# Patient Record
Sex: Female | Born: 1975 | Hispanic: No | Marital: Married | Smoking: Never smoker
Health system: Southern US, Community
[De-identification: ages and names within clinical notes are randomized; demographics above are authoritative.]

## PROBLEM LIST (undated history)

## (undated) ENCOUNTER — Inpatient Hospital Stay (HOSPITAL_COMMUNITY): Payer: Self-pay

## (undated) DIAGNOSIS — Z789 Other specified health status: Secondary | ICD-10-CM

## (undated) HISTORY — PX: NO PAST SURGERIES: SHX2092

---

## 1999-06-04 ENCOUNTER — Emergency Department (HOSPITAL_COMMUNITY): Admission: EM | Admit: 1999-06-04 | Discharge: 1999-06-04 | Payer: Self-pay | Admitting: Emergency Medicine

## 2000-03-26 ENCOUNTER — Other Ambulatory Visit: Admission: RE | Admit: 2000-03-26 | Discharge: 2000-03-26 | Payer: Self-pay | Admitting: Obstetrics & Gynecology

## 2000-09-13 ENCOUNTER — Inpatient Hospital Stay (HOSPITAL_COMMUNITY): Admission: AD | Admit: 2000-09-13 | Discharge: 2000-09-14 | Payer: Self-pay | Admitting: Obstetrics and Gynecology

## 2002-07-05 ENCOUNTER — Other Ambulatory Visit: Admission: RE | Admit: 2002-07-05 | Discharge: 2002-07-05 | Payer: Self-pay | Admitting: Obstetrics & Gynecology

## 2003-08-27 ENCOUNTER — Other Ambulatory Visit: Admission: RE | Admit: 2003-08-27 | Discharge: 2003-08-27 | Payer: Self-pay | Admitting: *Deleted

## 2004-05-30 ENCOUNTER — Inpatient Hospital Stay (HOSPITAL_COMMUNITY): Admission: AD | Admit: 2004-05-30 | Discharge: 2004-05-30 | Payer: Self-pay | Admitting: Obstetrics and Gynecology

## 2004-12-20 ENCOUNTER — Inpatient Hospital Stay (HOSPITAL_COMMUNITY): Admission: AD | Admit: 2004-12-20 | Discharge: 2004-12-22 | Payer: Self-pay | Admitting: *Deleted

## 2014-09-14 ENCOUNTER — Encounter (HOSPITAL_COMMUNITY): Payer: Self-pay | Admitting: *Deleted

## 2014-09-14 ENCOUNTER — Inpatient Hospital Stay (HOSPITAL_COMMUNITY): Payer: Self-pay

## 2014-09-14 ENCOUNTER — Inpatient Hospital Stay (HOSPITAL_COMMUNITY)
Admission: AD | Admit: 2014-09-14 | Discharge: 2014-09-14 | Disposition: A | Discharge status: Home / Self Care | Payer: Self-pay | Source: Ambulatory Visit | Attending: Obstetrics & Gynecology | Admitting: Obstetrics & Gynecology

## 2014-09-14 DIAGNOSIS — Z3A01 Less than 8 weeks gestation of pregnancy: Secondary | ICD-10-CM | POA: Insufficient documentation

## 2014-09-14 DIAGNOSIS — O209 Hemorrhage in early pregnancy, unspecified: Secondary | ICD-10-CM | POA: Insufficient documentation

## 2014-09-14 DIAGNOSIS — O26891 Other specified pregnancy related conditions, first trimester: Secondary | ICD-10-CM | POA: Insufficient documentation

## 2014-09-14 DIAGNOSIS — O4691 Antepartum hemorrhage, unspecified, first trimester: Secondary | ICD-10-CM

## 2014-09-14 DIAGNOSIS — N898 Other specified noninflammatory disorders of vagina: Secondary | ICD-10-CM | POA: Insufficient documentation

## 2014-09-14 HISTORY — DX: Other specified health status: Z78.9

## 2014-09-14 LAB — URINALYSIS, ROUTINE W REFLEX MICROSCOPIC
Bilirubin Urine: NEGATIVE
Glucose, UA: NEGATIVE mg/dL
Ketones, ur: NEGATIVE mg/dL
NITRITE: NEGATIVE
PH: 6 (ref 5.0–8.0)
Protein, ur: NEGATIVE mg/dL
UROBILINOGEN UA: 0.2 mg/dL (ref 0.0–1.0)

## 2014-09-14 LAB — CBC
HEMATOCRIT: 33.8 % — AB (ref 36.0–46.0)
Hemoglobin: 11.7 g/dL — ABNORMAL LOW (ref 12.0–15.0)
MCH: 32.1 pg (ref 26.0–34.0)
MCHC: 34.6 g/dL (ref 30.0–36.0)
MCV: 92.9 fL (ref 78.0–100.0)
PLATELETS: 270 10*3/uL (ref 150–400)
RBC: 3.64 MIL/uL — AB (ref 3.87–5.11)
RDW: 12.8 % (ref 11.5–15.5)
WBC: 8.6 10*3/uL (ref 4.0–10.5)

## 2014-09-14 LAB — WET PREP, GENITAL
Clue Cells Wet Prep HPF POC: NONE SEEN
Trich, Wet Prep: NONE SEEN
YEAST WET PREP: NONE SEEN

## 2014-09-14 LAB — URINE MICROSCOPIC-ADD ON

## 2014-09-14 LAB — ABO/RH: ABO/RH(D): B POS

## 2014-09-14 LAB — HCG, QUANTITATIVE, PREGNANCY: hCG, Beta Chain, Quant, S: 8698 m[IU]/mL — ABNORMAL HIGH (ref <5)

## 2014-09-14 LAB — POCT PREGNANCY, URINE: PREG TEST UR: POSITIVE — AB

## 2014-09-14 MED ORDER — CEPHALEXIN 500 MG PO CAPS: 500.0000 mg | ORAL_CAPSULE | Freq: Three times a day (TID) | ORAL | Status: AC

## 2014-09-14 NOTE — Discharge Instructions (Signed)
Ice/Tylenol for discomfort.    Reposo plvico  (Pelvic Rest) El reposo plvico se recomienda a las mujeres cuando:   La placenta cubre parcial o completamente la abertura del cuello del tero (placenta previa).  Hay sangrado entre la pared del tero y el saco amnitico en el primer trimestre (hemorragia subcorinica).  El cuello uterino comienza a abrirse sin iniciarse el trabajo de parto (cuello uterino incompetente, insuficiencia cervical).  El Rosstrabajo de parto se inicia muy pronto (parto prematuro). INSTRUCCIONES PARA EL CUIDADO EN EL HOGAR   No tenga relaciones sexuales, estimulacin, ni orgasmos.  No use tampones, no se haga duchas vaginales ni coloque ningn objeto en la vagina.  No levante objetos que pesen ms de 10 libras (4,5 kg).  Evite las actividades extenuantes o tensionar los msculos de la pelvis. SOLICITE ATENCIN MDICA SI:   Tiene un sangrado vaginal durante el embarazo. Considrelo como una posible emergencia.  Siente clicos en la zona baja del estmago (ms fuertes que los clicos menstruales).  Nota flujo vaginal (acuoso, con moco o [place]oss[place]angre).  Siente un dolor en la espalda leve y sordo.  Tiene contracciones regulares o endurecimiento del tero. SOLICITE ATENCIN MDICA DE INMEDIATO SI:  Observa sangrado vaginal y tiene placenta previa.  Document Released: 06/29/2012 [facility] Patient Information 2015 [place]ExitCare, MarylandLLC. This information is not intended to replace advice given to you by your health care provider. Make sure you discuss any questions you have with your health care provider. Hemorragia vaginal durante el embarazo (primer trimestre) (Vaginal Bleeding During Pregnancy, First Trimester) Durante los primeros meses de G[place]enembarazo, es comn tener una pequea hemorragia vaginal (manchas). A veces, la hemorragia es normal y no representa un problema, pero en algunas ocasiones es un sntoma de algo grave. Asegrese de decirle a su mdico de inmediato si  tiene algn tipo de hemorragia vaginal. CUIDADOS EN EL HOGAR  Controle su afeccin para ver si hay cambios.  Siga las indicaciones de su mdico con respecto al Irwingrado de actividad que [place]puede tener.  Si debe hacer reposo en cama:  Es posible que deba quedarse en cama y levantarse nicamente para ir al bao.  Quizs le permitan hacer The PNC Financialalgunas actividades.  Si es necesario, planifique que alguien la ayude.  Marcelino FreestoneEscriba:  La cantidad de toallas higinicas que [place]ausa cada da.  La frecuencia con la que se cambia las toallas higinicas.  Indique que tan empapados (saturados) estn.  No use tampones.  No se haga duchas vaginales.  No tenga relaciones sexuales ni orgasmos hasta que el mdico la autorice.  Si elimina tejido por la vagina, gurdelo para mostrrselo al American Expressmdico.  Tome los medicamentos solamente como se lo haya indicado el mdico.  No tome aspirina, ya que puede causar hemorragias.  Concurra a todas las visitas de control como se lo haya indicado el mdico. SOLICITE AYUDA SI:   Tiene una hemorragia vaginal.  Tiene clicos.  Tiene dolores de [place]parto.  Tiene fiebre que no desaparece despus de Teacher, adult educationtomar medicamentos. SOLICITE AYUDA DE INMEDIATO SI:   Siente clicos muy intensos en la espalda o en el vientre (abdomen).  Elimina cogulos grandes o tejido por la vagina.  Tiene ms hemorragia.  Se siente dbil o que va a desvanecerse.  Pierde el conocimiento (se desmaya).  Tiene escalofros.  Tiene una prdida importante o sale lquido a borbotones por la vagina.  Se desmaya mientras defeca. ASEGRESE DE QUE:  Comprende estas instrucciones.  Controlar su afeccin.  Recibir ayuda de inmediato si no mejora o si  empeora. Document Released: 02/19/2014 [facility]Exit[facility] Patient Information 2015 [place]villeExitCare, MarylandLLC. This information is not intended to replace advice given to you by your health care provider. Make sure you discuss any questions you have with your health care  provider.

## 2014-09-14 NOTE — MAU Note (Addendum)
thinks she is 6 wks preg, started bleeding last night, coffee colored d/c today. No pain, some itching.

## 2014-09-14 NOTE — MAU Provider Note (Signed)
History     CSN: [id number]  Arrival date and time: 09/14/14 1157   First Provider Initiated Contact with Patient 09/14/14 1314      Chief Complaint  Patient presents with  . Possible Pregnancy  . Vaginal Bleeding   HPI Melanie CroftMaria De Laluz [id number] 38 y.o. [id number] @[redacted]w[redacted]d  presents to MAU complaining of vaginal bleeding that started yesterday.  It was noticed last night with wiping and she has had some old dark blood passed today.  She denies abdominal pain, nausea, vomiting, fever, weakness, dysuria.   OB History    Gravida Para Term Preterm AB TAB SAB Ectopic Multiple Living   3 2 2       2       Past Medical History  Diagnosis Date  . Medical history non-contributory     Past Surgical History  Procedure Laterality Date  . No past surgeries      No family history on file.  History  Substance Use Topics  . Smoking status: Never Smoker   . Smokeless tobacco: Not on file  . Alcohol Use: No    Allergies: No Known Allergies  No prescriptions prior to admission    ROS Pertinent ROS in HPI Physical Exam   Blood pressure 123/74, pulse 82, temperature 99.2 F (37.3 C), temperature source Oral, resp. rate 16, height 4\' 10"  (1.473 m), weight 122 lb (55.339 kg), last menstrual period 07/31/2014.  Physical Exam  Constitutional: She is oriented to person, place, and time. She appears well-developed and well-nourished.  HENT:  Head: Normocephalic and atraumatic.  Eyes: EOM are normal.  Neck: Normal range of motion.  Cardiovascular: Normal rate and regular rhythm.   Respiratory: Effort normal and breath sounds normal. No respiratory distress.  GI: Soft. Bowel sounds are normal. She exhibits no distension. There is no tenderness. There is no rebound and no guarding.  Genitourinary:     Labia minora overlaying clitoral hood on left significant for red raised lesion approx 7mm, tender to palpation with no discharge.   Scant red blood visible in vagina.  Separately  distinguishable white vaginal discharge.  Wet prep obtained.   No CMT. No adnexal mass or tenderness   Musculoskeletal: Normal range of motion.  Neurological: She is alert and oriented to person, place, and time.  Skin: Skin is warm and dry.  Psychiatric: She has a normal mood and affect.   Results for orders placed or performed during the hospital encounter of 09/14/14 (from the past 24 hour(s))  Urinalysis, Routine w reflex microscopic     Status: Abnormal   Collection Time: 09/14/14 12:15 PM  Result Value Ref Range   Color, Urine STRAW (A) YELLOW   APPearance CLEAR CLEAR   Specific Gravity, Urine <1.005 (L) 1.005 - 1.030   pH 6.0 5.0 - 8.0   Glucose, UA NEGATIVE NEGATIVE mg/dL   Hgb urine dipstick SMALL (A) NEGATIVE   Bilirubin Urine NEGATIVE NEGATIVE   Ketones, ur NEGATIVE NEGATIVE mg/dL   Protein, ur NEGATIVE NEGATIVE mg/dL   Urobilinogen, UA 0.2 0.0 - 1.0 mg/dL   Nitrite NEGATIVE NEGATIVE   Leukocytes, UA SMALL (A) NEGATIVE  Urine microscopic-add on     Status: Abnormal   Collection Time: 09/14/14 12:15 PM  Result Value Ref Range   Squamous Epithelial / LPF FEW (A) RARE   WBC, UA 3-6 <3 WBC/hpf   RBC / HPF 0-2 <3 RBC/hpf  Pregnancy, urine POC     Status: Abnormal   Collection Time: 09/14/14 12:18 PM  Result Value Ref Range   Preg Test, Ur POSITIVE (A) NEGATIVE  CBC     Status: Abnormal   Collection Time: 09/14/14  1:26 PM  Result Value Ref Range   WBC 8.6 4.0 - 10.5 K/uL   RBC 3.64 (L) 3.87 - 5.11 MIL/uL   Hemoglobin 11.7 (L) 12.0 - 15.0 g/dL   HCT 84.133.8 (L) 32.436.0 - 40.146.0 %   MCV 92.9 78.0 - 100.0 fL   MCH 32.1 26.0 - 34.0 pg   MCHC 34.6 30.0 - 36.0 g/dL   RDW 02.712.8 25.311.5 - 66.415.5 %   Platelets 270 150 - 400 K/uL  ABO/Rh     Status: None   Collection Time: 09/14/14  1:26 PM  Result Value Ref Range   ABO/RH(D) B POS   hCG, quantitative, pregnancy     Status: Abnormal   Collection Time: 09/14/14  1:26 PM  Result Value Ref Range   hCG, Beta Chain, Quant, S 8698 (H) <5  mIU/mL  Wet prep, genital     Status: Abnormal   Collection Time: 09/14/14  1:30 PM  Result Value Ref Range   Yeast Wet Prep HPF POC NONE SEEN NONE SEEN   Trich, Wet Prep NONE SEEN NONE SEEN   Clue Cells Wet Prep HPF POC NONE SEEN NONE SEEN   WBC, Wet Prep HPF POC FEW (A) NONE SEEN   [place] Ob Comp Less 14 Wks  09/14/2014   CLINICAL DATA:  Pregnant, vaginal bleeding  EXAM: OBSTETRIC <14 WK US AND TRANSVAGINAL OB US  TECHNIQUE: Both transabdominal and transvaginal ultrasound examinations were performed for complete evaluation of the gestation as well as the maternal uterus, adnexal regions, and pelvic cul-de-sac. Transvaginal technique was performed to assess early pregnancy.  COMPARISON:  None.  FINDINGS: Intrauterine gestational sac: Visualized/normal in shape.  Yolk sac:  Present  Embryo:  Present  Cardiac Activity: Not visualized  MSD:  10.4  mm   5 w   5 d  US EDC: 01/2005  CRL:  1.3 mm, not within measurable range  Maternal uterus/adnexae: Moderate subchronic hemorrhage.  Right ovary is within normal limits, measuring 2.3 x 1.4 x 1.6 cm.  Left ovary measures 3.2 x 1.9 x 2.1 cm and is notable for a corpus luteal cyst.  No free fluid.  IMPRESSION: Single intrauterine gestation, measuring 5 weeks 5 days by mean sac diameter. Crown-rump measures 1.3 mm, which is not within the measurable range for estimated gestational age.  No definite cardiac activity is visualized, although this may be due to early gestational age.  Follow-up pelvic ultrasound is suggested in 7 days to confirm viability.   Electronically Signed   By: Charline BillsSriyesh  Krishnan M.D.   On: 09/14/2014 14:35   [place] Ob Transvaginal  09/14/2014   CLINICAL DATA:  Pregnant, vaginal bleeding  EXAM: OBSTETRIC <14 WK US AND TRANSVAGINAL OB US  TECHNIQUE: Both transabdominal and transvaginal ultrasound examinations were performed for complete evaluation of the gestation as well as the maternal uterus, adnexal regions, and pelvic cul-de-sac. Transvaginal  technique was performed to assess early pregnancy.  COMPARISON:  None.  FINDINGS: Intrauterine gestational sac: Visualized/normal in shape.  Yolk sac:  Present  Embryo:  Present  Cardiac Activity: Not visualized  MSD:  10.4  mm   5 w   5 d  US EDC: 01/2005  CRL:  1.3 mm, not within measurable range  Maternal uterus/adnexae: Moderate subchronic hemorrhage.  Right ovary is within normal limits, measuring 2.3 x 1.4 x 1.6  cm.  Left ovary measures 3.2 x 1.9 x 2.1 cm and is notable for a corpus luteal cyst.  No free fluid.  IMPRESSION: Single intrauterine gestation, measuring 5 weeks 5 days by mean sac diameter. Crown-rump measures 1.3 mm, which is not within the measurable range for estimated gestational age.  No definite cardiac activity is visualized, although this may be due to early gestational age.  Follow-up pelvic ultrasound is suggested in 7 days to confirm viability.   Electronically Signed   By: Charline Bills M.D.   On: 09/14/2014 14:35     MAU Course  Procedures  MDM Vaginal bleeding in setting of positive PRT.  [place] ordered.  No evidence for ectopic.  Yolk sac present.    Assessment and Plan  A: vaginal bleeding in pregnancy, small cyst of external genitalia  P: Discharge to home Obtain [facility] asap Keflex for cyst Tylenol/ice for discomfort Pelvic rest Patient may return to MAU as needed or if her condition were to change or worsen   Bertram Denver 09/14/2014, 1:46 PM

## 2014-09-14 NOTE — MAU Note (Signed)
Pt states she has a bump which appeared two weeks ago in her vagina.  Also is having some vaginal itching.

## 2014-09-15 LAB — GC/CHLAMYDIA PROBE AMP
CT Probe RNA: NEGATIVE
GC Probe RNA: NEGATIVE

## 2014-10-23 LAB — PROCEDURE REPORT - SCANNED: PAP SMEAR: NEGATIVE

## 2014-10-29 ENCOUNTER — Other Ambulatory Visit: Payer: Self-pay | Admitting: Obstetrics

## 2014-10-29 DIAGNOSIS — O019 Hydatidiform mole, unspecified: Secondary | ICD-10-CM

## 2014-11-01 ENCOUNTER — Ambulatory Visit (HOSPITAL_COMMUNITY)
Admission: RE | Admit: 2014-11-01 | Discharge: 2014-11-01 | Disposition: A | Discharge status: Home / Self Care | Payer: Self-pay | Source: Ambulatory Visit | Attending: Obstetrics | Admitting: Obstetrics

## 2014-11-01 DIAGNOSIS — O019 Hydatidiform mole, unspecified: Secondary | ICD-10-CM

## 2014-11-01 DIAGNOSIS — Z36 Encounter for antenatal screening of mother: Secondary | ICD-10-CM | POA: Insufficient documentation

## 2014-11-01 DIAGNOSIS — Z3A08 8 weeks gestation of pregnancy: Secondary | ICD-10-CM | POA: Insufficient documentation

## 2015-07-20 ENCOUNTER — Encounter (HOSPITAL_COMMUNITY): Payer: Self-pay | Admitting: *Deleted

## 2015-11-06 IMAGING — US US OB TRANSVAGINAL
1 series · 13 of 28 positions shown · non-contrast
Comparison: Ultrasound 09/14/2014

CLINICAL DATA: Patient for follow-up ultrasound. Prior suspicious
area raising the possibly of molar pregnancy.

EXAM:
TRANSVAGINAL OB ULTRASOUND
TECHNIQUE: Transvaginal ultrasound was performed for complete evaluation of the
gestation as well as the maternal uterus, adnexal regions, and
pelvic cul-de-sac.

[Series 1: us ob transvaginal · 72 acquisitions, 13 frames shown]
[im 3/72]
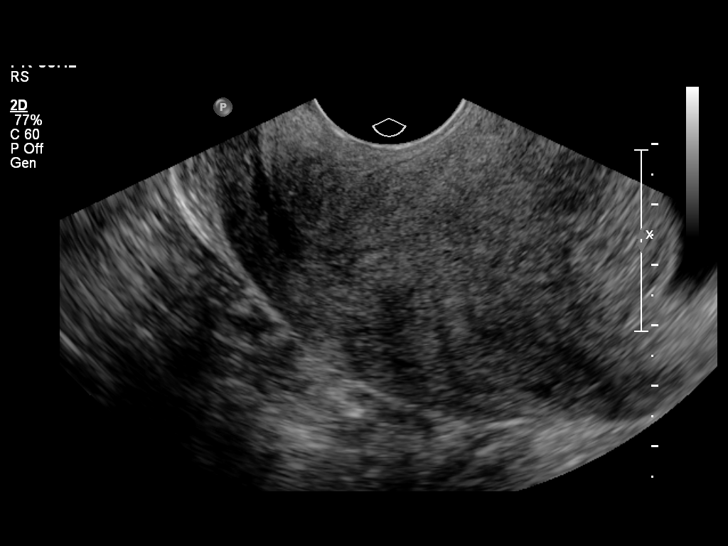
[im 8/72]
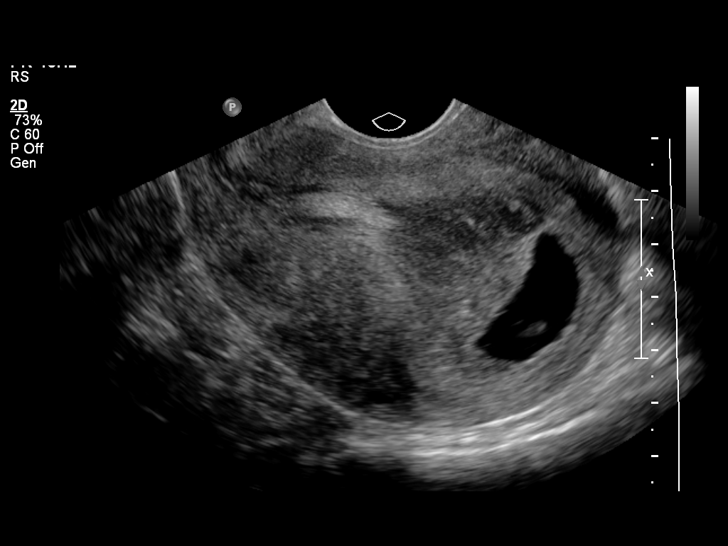
[im 14/72]
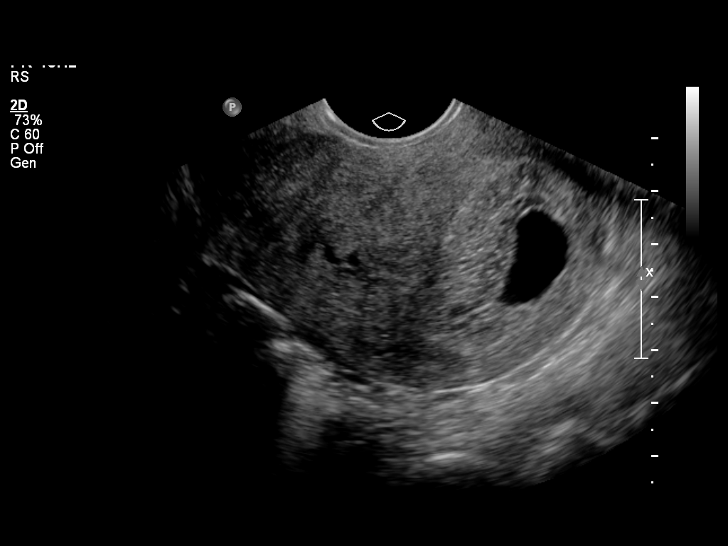
[im 19/72]
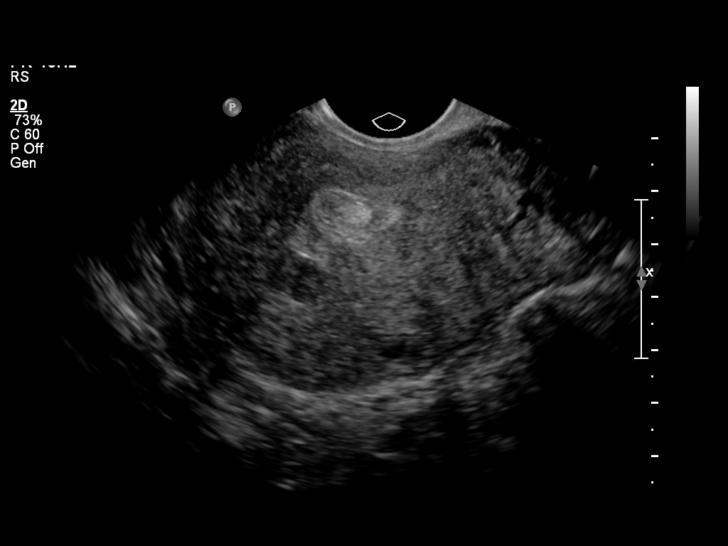
[im 24/72]
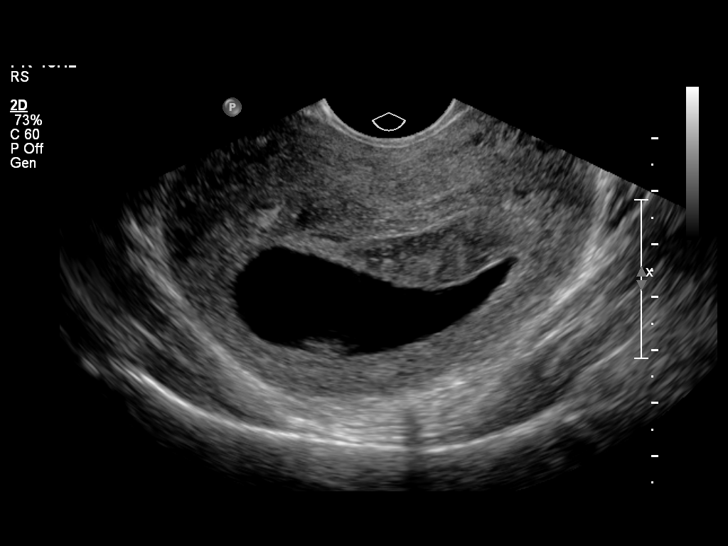
[im 29/72]
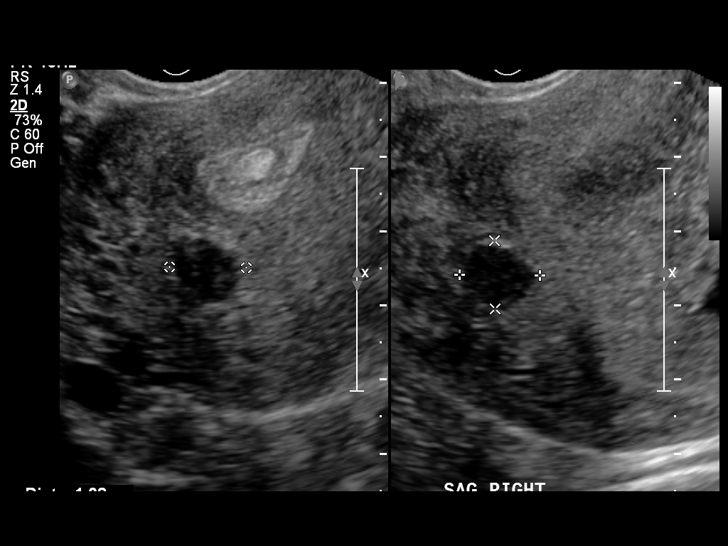
[im 37/72]
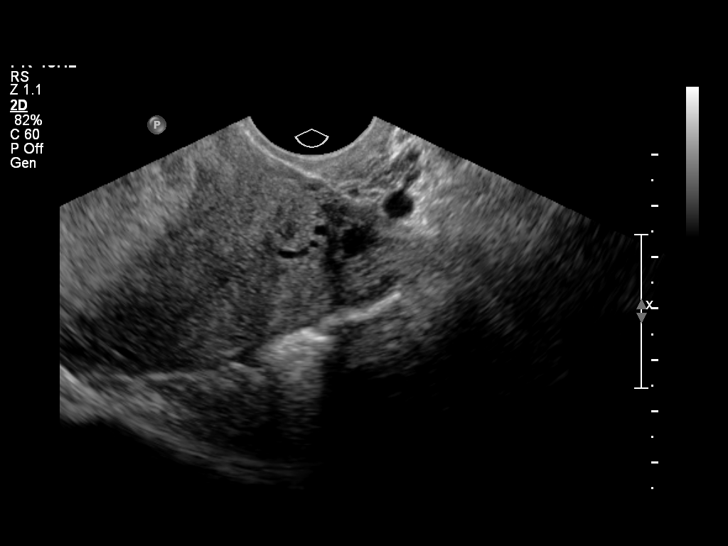
[im 43/72]
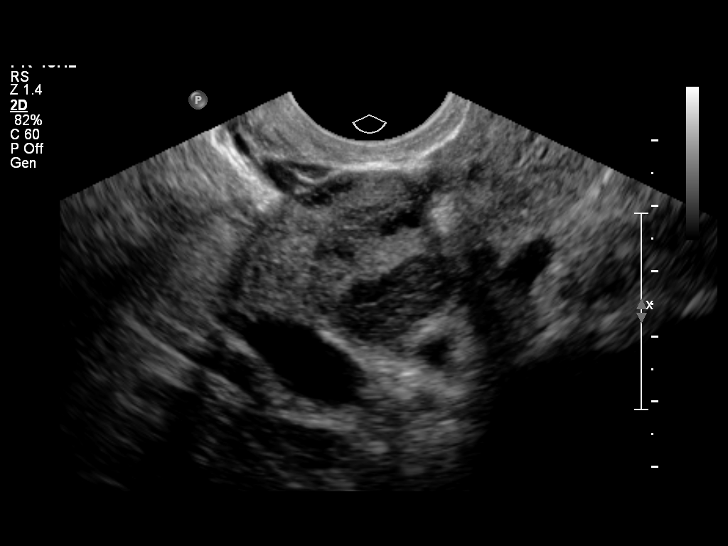
[im 48/72]
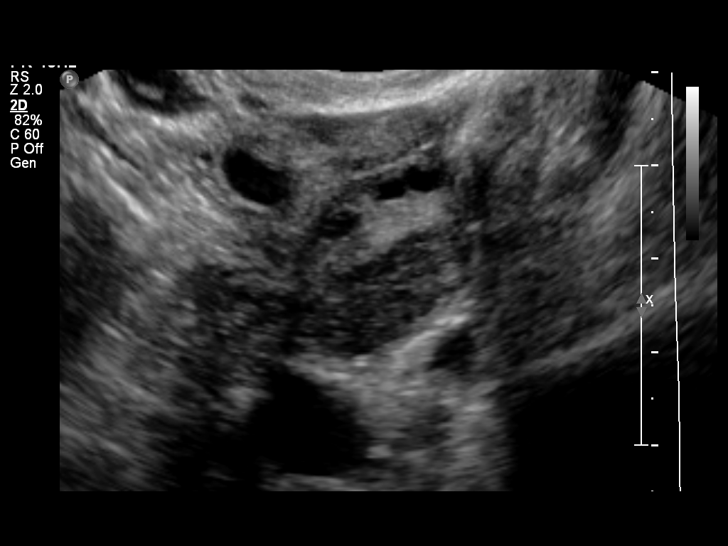
[im 53/72]
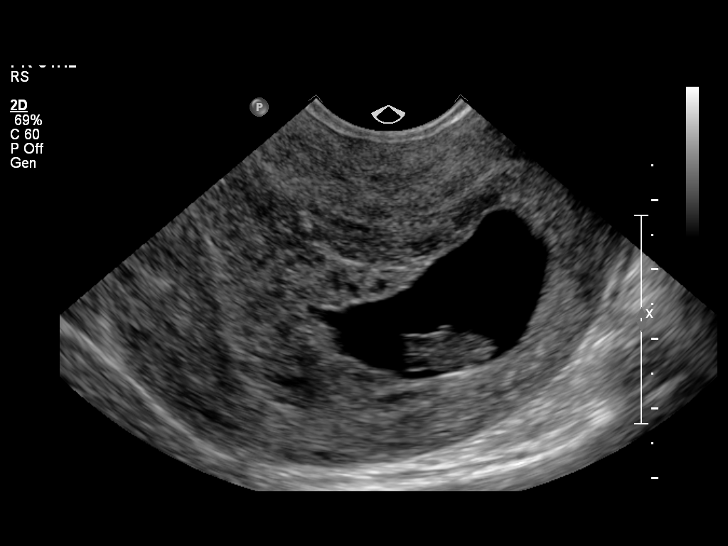
[im 58/72]
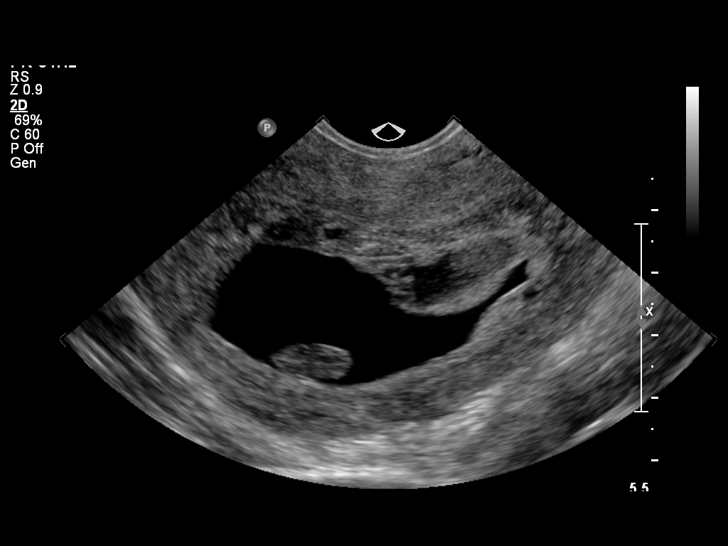
[im 64/72]
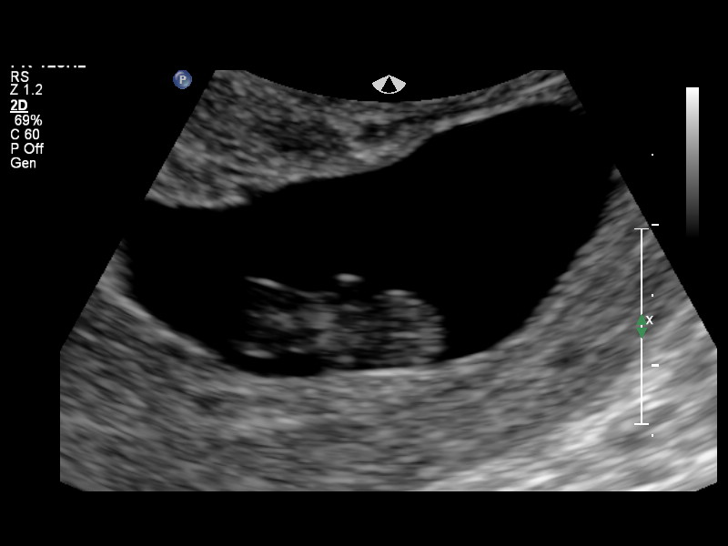
[im 69/72]
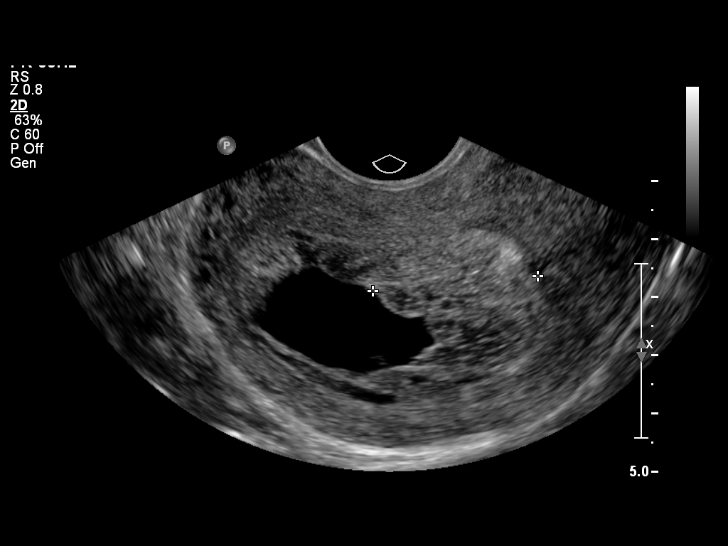

[13 of 28 positions shown; findings below may reference images not displayed]

FINDINGS: Intrauterine gestational sac: Present

Yolk sac:  Not visualized

Embryo:  Present

Cardiac Activity: Not present

CRL:   15.8  mm   8 w 0 d

Maternal uterus/adnexae: Adjacent to the gestational sac is a mixed
echogenicity mass with multiple internal cystic spaces. Additionally
there is a 10 x 9 x 10 mm intramural fibroid. The left ovary is
remarkable for a probable small corpus luteum. The right ovary is
not visualized. Subchorionic hematoma is demonstrated. No free fluid
in the pelvis.
IMPRESSION: 1. Fetal pole 15.8 mm. No fetal heartbeat identified. Findings meet
definitive criteria for failed pregnancy. This follows SRU consensus
guidelines: Diagnostic Criteria for Nonviable Pregnancy Early in the
First Trimester. N Engl J Med 0397;[DATE].
2. Within the uterus there is a multi cystic soft tissue mass which
is nonspecific and may represent cystic change of the placenta or
gestational trophoblastic disease. Recommend clinical, pathologic
and laboratory correlation.
Critical Value/emergent results were called by telephone at the time
of interpretation on 11/01/2014 at [DATE] to Dr. DAMARY BING ,
who verbally acknowledged these results.

## 2016-07-13 ENCOUNTER — Encounter: Payer: Self-pay | Admitting: *Deleted

## 2016-07-13 LAB — PROCEDURE REPORT - SCANNED: Pap: NEGATIVE

## 2016-07-15 ENCOUNTER — Encounter: Payer: Self-pay | Admitting: *Deleted

## 2018-10-31 ENCOUNTER — Other Ambulatory Visit (HOSPITAL_COMMUNITY): Payer: Self-pay | Admitting: *Deleted

## 2018-10-31 ENCOUNTER — Other Ambulatory Visit (HOSPITAL_COMMUNITY): Payer: Self-pay | Admitting: Obstetrics and Gynecology

## 2018-10-31 DIAGNOSIS — N644 Mastodynia: Secondary | ICD-10-CM

## 2018-12-06 ENCOUNTER — Ambulatory Visit (HOSPITAL_COMMUNITY): Payer: Self-pay

## 2018-12-06 ENCOUNTER — Other Ambulatory Visit: Payer: Self-pay

## 2019-04-26 ENCOUNTER — Other Ambulatory Visit (HOSPITAL_COMMUNITY): Payer: Self-pay | Admitting: *Deleted

## 2019-04-26 DIAGNOSIS — N644 Mastodynia: Secondary | ICD-10-CM

## 2019-06-01 ENCOUNTER — Other Ambulatory Visit: Payer: Self-pay

## 2019-06-01 ENCOUNTER — Other Ambulatory Visit (HOSPITAL_COMMUNITY): Payer: Self-pay | Admitting: Obstetrics and Gynecology

## 2019-06-01 ENCOUNTER — Encounter (HOSPITAL_COMMUNITY): Payer: Self-pay

## 2019-06-01 ENCOUNTER — Ambulatory Visit (HOSPITAL_COMMUNITY)
Admission: RE | Admit: 2019-06-01 | Discharge: 2019-06-01 | Disposition: A | Discharge status: Home / Self Care | Payer: Self-pay | Source: Ambulatory Visit | Attending: Obstetrics and Gynecology | Admitting: Obstetrics and Gynecology

## 2019-06-01 ENCOUNTER — Ambulatory Visit
Admission: RE | Admit: 2019-06-01 | Discharge: 2019-06-01 | Disposition: A | Discharge status: Home / Self Care | Payer: No Typology Code available for payment source | Source: Ambulatory Visit | Attending: Obstetrics and Gynecology | Admitting: Obstetrics and Gynecology

## 2019-06-01 ENCOUNTER — Ambulatory Visit: Payer: Self-pay

## 2019-06-01 DIAGNOSIS — N644 Mastodynia: Secondary | ICD-10-CM | POA: Insufficient documentation

## 2019-06-01 DIAGNOSIS — Z1239 Encounter for other screening for malignant neoplasm of breast: Secondary | ICD-10-CM | POA: Insufficient documentation

## 2019-06-01 DIAGNOSIS — N6325 Unspecified lump in the left breast, overlapping quadrants: Secondary | ICD-10-CM | POA: Insufficient documentation

## 2019-06-01 DIAGNOSIS — R921 Mammographic calcification found on diagnostic imaging of breast: Secondary | ICD-10-CM

## 2019-06-01 NOTE — Patient Instructions (Signed)
Explained breast self awareness with  Melanie Gibson. Patient did not need a Pap smear today due to last Pap smear was 07/13/2016. Let her know BCCCP will cover Pap smears every 3 years unless has a history of abnormal Pap smears and that her next Pap smear is due the end of September 2020. Referred patient to the [facility] for a diagnostic mammogram and left breast ultrasound. Appointment scheduled for Thursday, June 01, 2019 at 1400. Patient aware of appointment and will be there.  Melanie Gibson verbalized understanding.  Keisha Amer, Arvil Chaco, RN 1:49 PM

## 2019-06-01 NOTE — Progress Notes (Signed)
Complaints of left inner breast pain and lump x one year. Patient states the pain comes and goes. Patient rates the pain at a 7 out of 10.  Pap Smear: Pap smear not completed today. Last Pap smear was 07/13/2016 and normal. Per patient has no history of an abnormal Pap smear. Last two Pap smear results are in Epic.  Physical exam: Breasts Right breast slightly larger than left breast that patient has not noticed any change. No skin abnormalities bilateral breasts. No nipple retraction bilateral breasts. Patient is currently breast feeding. No lymphadenopathy. No lumps palpated right breast. Palpated a pea sized mobile lump within the left breast at 9 o'clock 4 cm from the nipple. Complaints of left inner breast tenderness on exam. Referred patient to the [facility] for a diagnostic mammogram and left breast ultrasound. Appointment scheduled for Thursday, June 01, 2019 at 1400.        Pelvic/Bimanual No Pap smear completed today since last Pap smear was 07/13/2016. Pap smear not indicated per BCCCP guidelines.   Smoking History: Patient has never smoked.  Patient Navigation: Patient education provided. Access to services provided for patient through [facility] program. Spanish interpreter provided.   Breast and Cervical Cancer Risk Assessment: Patient has no family history of breast cancer, known genetic mutations, or radiation treatment to the chest before age 37. Patient has no history of cervical dysplasia, immunocompromised, or DES exposure in-utero.  Risk Assessment    Risk Scores      06/01/2019   Last edited by: Armond Hang, LPN   5-year risk: 0.4 %   Lifetime risk: 6.9 %         Used Spanish interpreter Rudene Anda from [place].

## 2019-06-07 ENCOUNTER — Encounter (HOSPITAL_COMMUNITY): Payer: Self-pay | Admitting: *Deleted

## 2019-06-21 ENCOUNTER — Other Ambulatory Visit: Payer: Self-pay

## 2019-06-21 ENCOUNTER — Inpatient Hospital Stay: Payer: Self-pay | Attending: Obstetrics and Gynecology | Admitting: *Deleted

## 2019-06-21 VITALS — BP 130/82 | Temp 97.3°F | Ht 59.75 in | Wt 131.0 lb

## 2019-06-21 DIAGNOSIS — Z0000 Encounter for general adult medical examination without abnormal findings: Secondary | ICD-10-CM

## 2019-06-21 NOTE — Progress Notes (Signed)
Wisewoman initial screening   interpreter- Rudene Anda, [employer]   Clinical Measurement:  Height: 59.75 in Weight: 131 lb  Blood Pressure: 122/74  Blood Pressure #2: 130/82 Fasting Labs Drawn Today, will review with patient when they result.   Medical History:  Patient states that she does not have a history of high cholesterol, high blood pressure or diabetes.  Medications:  Patient states that she does not take  medication to lower cholesterol, blood pressure or blood sugar. Patient does not take an aspirin a day to help prevent a heart attack or stroke.    Blood pressure, self measurement: Patient states that she measures blood pressure from home monthly. She does not share blood pressure readings with a healthcare provider.   Nutrition: Patient states that on average she eats 3 cups of fruit and 2 cups of vegetables per day. Patient states that she does not eat fish at least 2 times per week. Patient eats less than half servings of whole grains. Patient drinks less than 36 ounces of beverages with added sugar weekly. Patient is currently watching sodium or salt intake. In the past 7 days patient has not had any drinks containing alcohol. On average patient does not drink any drinks containing alcohol.      Physical activity:  Patient states that she gets 210 minutes of moderate and 0 minutes of vigorous physical activity each week.  Smoking status:  Patient states that she has never smoked tobacco.   Quality of life:  Over the past 2 weeks patient states that she has not had any days where she has little interest or pleasure in doing things and 0 days where she has felt down, depressed or hopeless.    Risk reduction and counseling:    Health Coaching: Spoke with patient about adding in an extra serving of vegetables daily in order to get 3 cups daily. Also talked about adding heart healthy fish into diet like salmon or tuna, twice a week. As well as eating whole grains like brown rice and  oatmeal.   Navigation:  I will notify patient of lab results.  Patient is aware of 2 more health coaching sessions and a follow up.  Time: 20 minutes

## 2019-06-22 LAB — HGB A1C W/O EAG: Hgb A1c MFr Bld: 5.6 % (ref 4.8–5.6)

## 2019-06-22 LAB — GLUCOSE, RANDOM: Glucose: 99 mg/dL (ref 65–99)

## 2019-06-22 LAB — LIPID PANEL W/O CHOL/HDL RATIO
Cholesterol, Total: 159 mg/dL (ref 100–199)
HDL: 54 mg/dL (ref >39)
LDL Chol Calc (NIH): 93 mg/dL (ref 0–99)
Triglycerides: 61 mg/dL (ref 0–149)
VLDL Cholesterol Cal: 12 mg/dL (ref 5–40)

## 2019-06-27 ENCOUNTER — Telehealth: Payer: Self-pay

## 2019-06-27 NOTE — Telephone Encounter (Signed)
Health coaching 2   interpreter- [place] [phone]   Labs- 159 cholesterol, 93 LDL cholesterol , 61 triglycerides , 54 HDL cholesterol , 5.6 hemoglobin A1C, 99 mean plasma glucose Patient understands and is aware of her lab results.   Goals-  Discussed lab results with patient. Answered any questions that she had regarding results. Informed patient that both glucose and hemoglobin A1C were within the normal range they were at the borderline of abnormal. Encouraged patient to watch her sweets and sugars and carbs that she consumes. Encouraged patient to continue daily exercise.   Goals- Increase vegetables intake to 3 servings per day. Try to add in more whole grains into diet like brown rice, oatmeal and whole wheat bread.    Navigation:  Patient is aware of 1 more health coaching sessions and a follow up.   Time- 10 minutes

## 2019-08-15 ENCOUNTER — Ambulatory Visit (HOSPITAL_COMMUNITY): Payer: No Typology Code available for payment source

## 2019-09-18 ENCOUNTER — Telehealth: Payer: Self-pay

## 2019-09-18 NOTE — Telephone Encounter (Signed)
Health Coaching 3  interpreter- Rudene Anda, [facility]   Goals- Patient states that she has increased her daily vegetable intake to 4-5 cups per day. Patient states that she has also been eating more whole grains. Patient states that she has been exercising some but needs to do more.    New goal- Encouraged patient to try and start walking for at least 20 minutes daily. Work on the amount of sweets and carbs consumed.   Barrier to reaching goal- none   Strategies to overcome- NA   Navigation:  Patient is aware of  a follow up session. Patient is scheduled for follow-up on October 23, 2019 @ 2:00 pm.   Time- 10 minutes

## 2019-09-25 ENCOUNTER — Other Ambulatory Visit: Payer: Self-pay

## 2019-09-25 ENCOUNTER — Other Ambulatory Visit: Payer: Self-pay | Admitting: *Deleted

## 2019-09-25 DIAGNOSIS — Z124 Encounter for screening for malignant neoplasm of cervix: Secondary | ICD-10-CM

## 2019-09-25 NOTE — Progress Notes (Signed)
Patient: Melanie Gibson           Date of Birth: [birth year]           MRN: [id number] Visit Date: 09/25/2019 PCP: Patient, No Pcp Per Temp: 97.3 Temporal   Cervical Exam Cervix friable and reddened around os. Per patient has no history of an abnormal Pap smear. Next Pap smear due in 3 years if normal.  Patient's History Patient Active Problem List   Diagnosis Date Noted  . Screening breast examination 06/01/2019  . Breast pain, left 06/01/2019  . Breast lump on left side at 9 o'clock position 06/01/2019   Past Medical History:  Diagnosis Date  . Medical history non-contributory     Family History  Problem Relation Age of Onset  . Diabetes Father   . Hypertension Father     Social History   Occupational History  . Not on file  Tobacco Use  . Smoking status: Never Smoker  . Smokeless tobacco: Never Used  Substance and Sexual Activity  . Alcohol use: No  . Drug use: No  . Sexual activity: Yes    Birth control/protection: None

## 2019-09-28 LAB — CYTOLOGY - PAP
Comment: NEGATIVE
Diagnosis: NEGATIVE
High risk HPV: NEGATIVE

## 2019-10-09 ENCOUNTER — Encounter (HOSPITAL_COMMUNITY): Payer: Self-pay

## 2019-10-09 ENCOUNTER — Telehealth (HOSPITAL_COMMUNITY): Payer: Self-pay | Admitting: *Deleted

## 2019-10-09 NOTE — Telephone Encounter (Signed)
Pap smear and HPV result letter mailed to patient.  

## 2019-10-23 ENCOUNTER — Ambulatory Visit: Payer: No Typology Code available for payment source

## 2019-11-22 ENCOUNTER — Other Ambulatory Visit: Payer: Self-pay

## 2019-11-22 ENCOUNTER — Inpatient Hospital Stay: Payer: Self-pay | Attending: Obstetrics and Gynecology | Admitting: *Deleted

## 2019-11-22 VITALS — BP 111/72 | Temp 98.0°F | Ht 59.75 in | Wt 141.0 lb

## 2019-11-22 DIAGNOSIS — Z0000 Encounter for general adult medical examination without abnormal findings: Secondary | ICD-10-CM

## 2019-11-22 NOTE — Progress Notes (Signed)
Wisewoman follow up  Interpreter: Natale Lay, [employer]   Clinical Measurement:  Height: 59.75 in Weight: 141 lb  Blood Pressure: 117/76  Blood Pressure #2: 111/72    Medical History: Patient states that she does not have a history of high cholesterol, blood pressure or diabetes.   Medications:  Patient states that she does not  take medication to lower cholesterol, blood pressure or blood sugar. Patient does not take an aspirin a day to help prevent a heart attack or stroke.   Blood pressure, self measurement:  Patient states that she does not measure blood pressure from home and has not been told to do so by a healthcare provider.    Nutrition:  Patient states that on average she eats 2 cups of fruit and 4 cups of vegetables per day. Patient states that she does not eat fish at least 2 times per week. In a typical day patient eats less than half servings of whole grains. Patient does not drink less than 36 ounces of beverages with added sugar weekly. Patient is currently watching sodium or salt intake. In the past 7 days patient has not had any drinks containing alcohol. On average patient does not drink any drinks containing alcohol.       Physical activity:  Patient states that she gets 90 minutes of moderate and minutes of vigorous physical activity each week.  Smoking status:  Patient states that she has never smoked tobacco.   Quality of life:  Over the past 2 weeks patient states that she has had 0 days where she has little interest or pleasure in doing things and several days where she has felt down, depressed or hopeless.    Navigation: This was the  follow up session for this patient, I will check up on her progress in the coming months.  Health Coaching: Encouraged patient to continue with fruit and vegetable intake. Encouraged patient to try and increase her servings of heart healthy fish to get the recommended 2 servings per week. Also encouraged patient to continue trying to  add whole grains into daily diet. Encouraged patient to try and reduce the amount of beverages with added sugars that she consumes to 36 oz a week or less. Patient will also try and start exercising more each week.  Time: 20 minutes

## 2019-12-11 ENCOUNTER — Other Ambulatory Visit: Payer: Self-pay | Admitting: Obstetrics and Gynecology

## 2019-12-11 ENCOUNTER — Ambulatory Visit
Admission: RE | Admit: 2019-12-11 | Discharge: 2019-12-11 | Disposition: A | Discharge status: Home / Self Care | Payer: No Typology Code available for payment source | Source: Ambulatory Visit | Attending: Obstetrics and Gynecology | Admitting: Obstetrics and Gynecology

## 2019-12-11 ENCOUNTER — Other Ambulatory Visit: Payer: Self-pay

## 2019-12-11 DIAGNOSIS — R921 Mammographic calcification found on diagnostic imaging of breast: Secondary | ICD-10-CM

## 2020-06-19 ENCOUNTER — Other Ambulatory Visit: Payer: Self-pay

## 2020-06-19 ENCOUNTER — Other Ambulatory Visit: Payer: No Typology Code available for payment source

## 2020-06-19 DIAGNOSIS — Z20822 Contact with and (suspected) exposure to covid-19: Secondary | ICD-10-CM

## 2020-06-20 LAB — NOVEL CORONAVIRUS, NAA: SARS-CoV-2, NAA: DETECTED — AB

## 2020-12-15 IMAGING — MG MM DIGITAL DIAGNOSTIC UNILAT*L* W/ TOMO W/ CAD
6 series · 6 of 14 positions shown · non-contrast
Comparison: Previous exam(s).

CLINICAL DATA: Six-month follow-up for likely benign left breast
calcifications.

EXAM:
DIGITAL DIAGNOSTIC UNILATERAL LEFT MAMMOGRAM WITH CAD AND TOMO

[L ML]
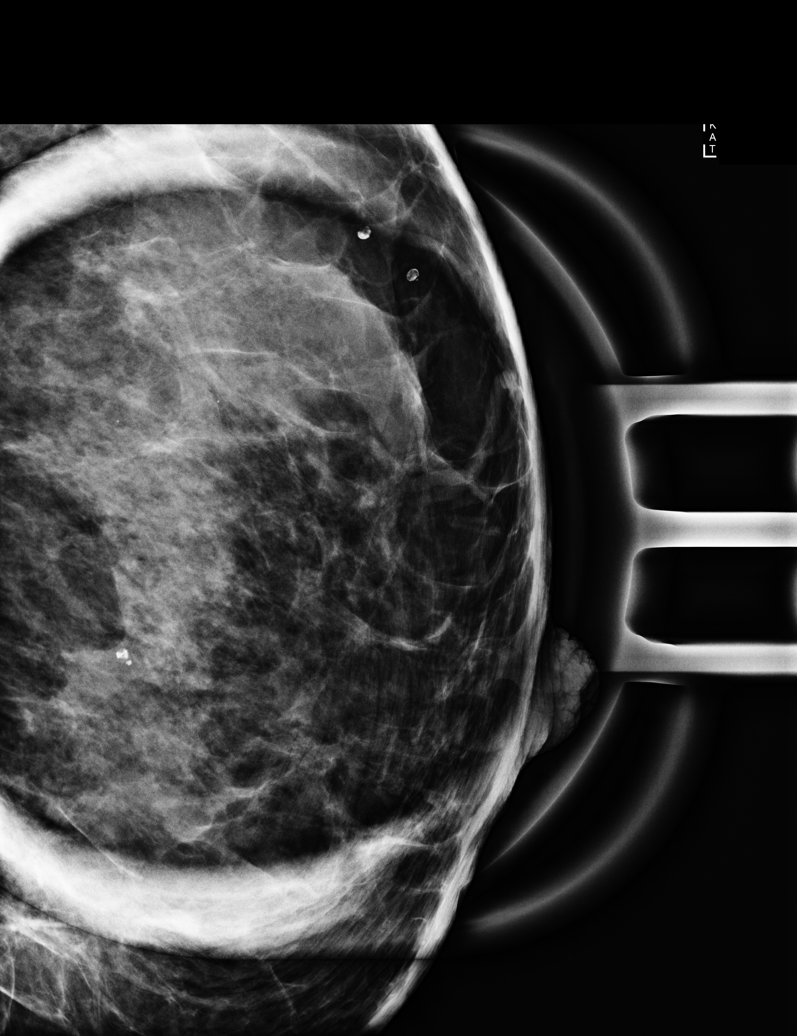

[L CC]
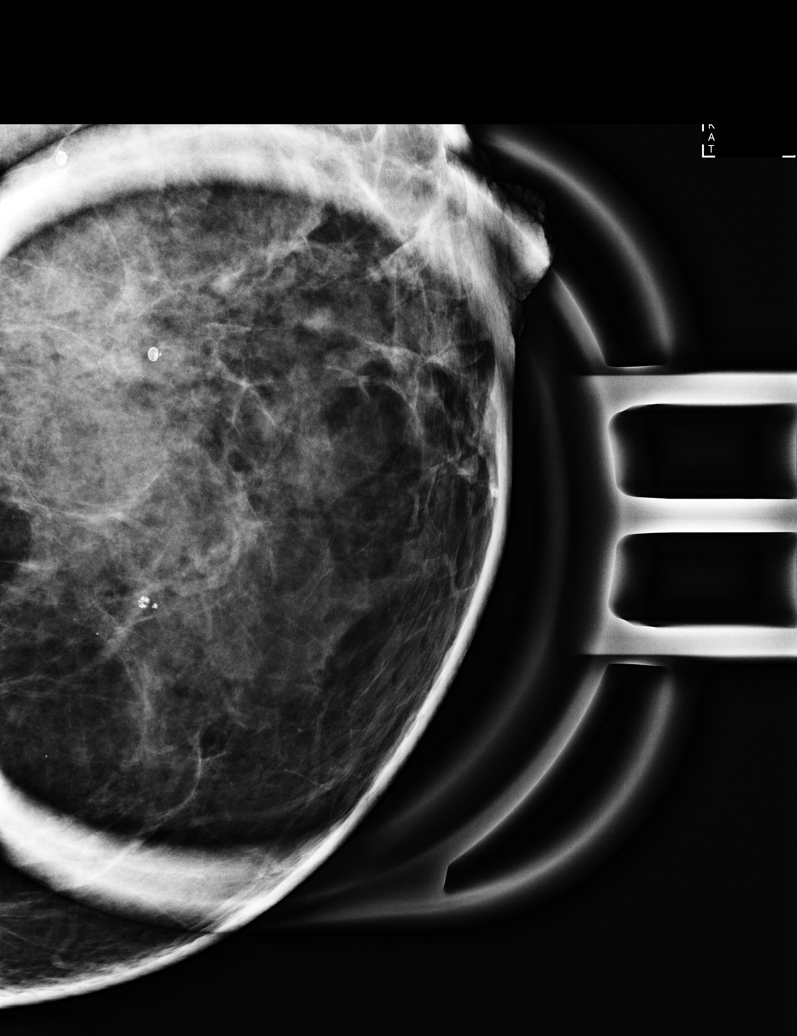

[L MLO synth-2D]
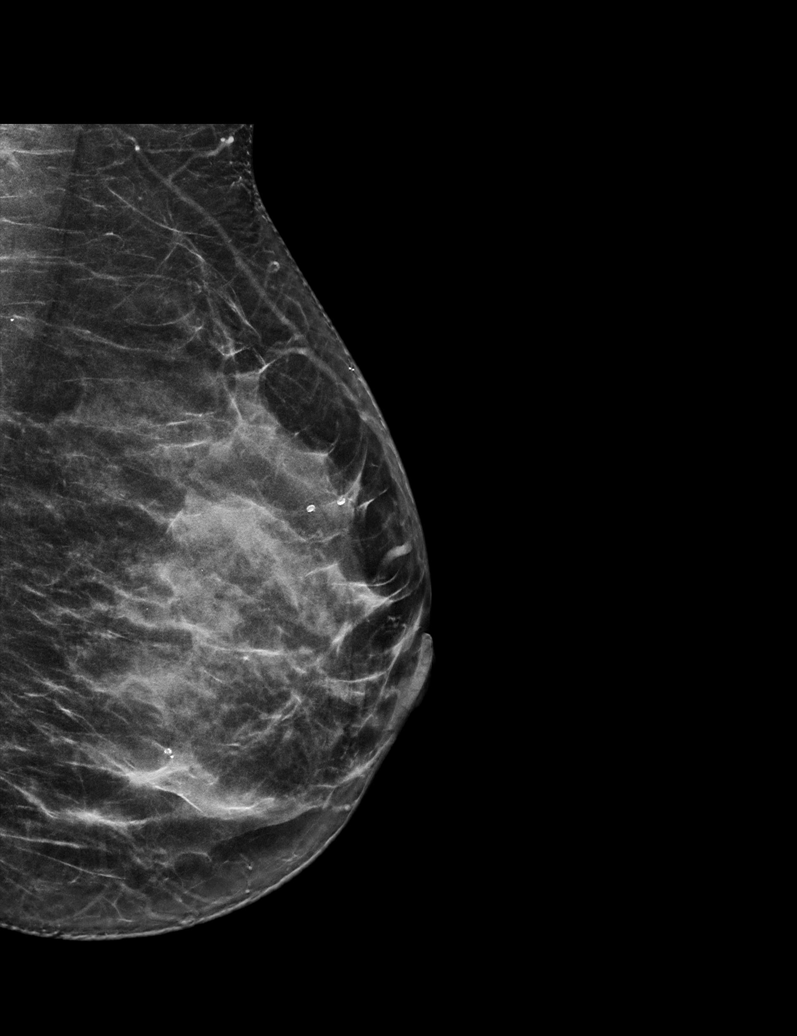

[L CC synth-2D]
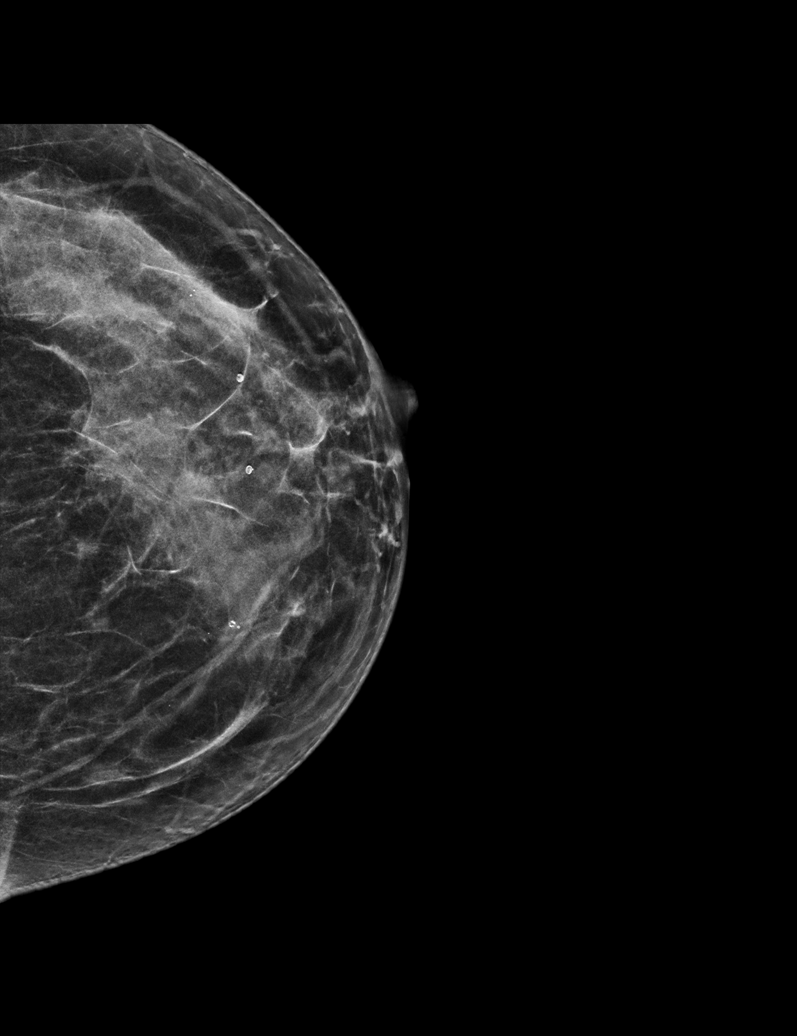

[L MLO tomo · tomo slice 29/58.0]
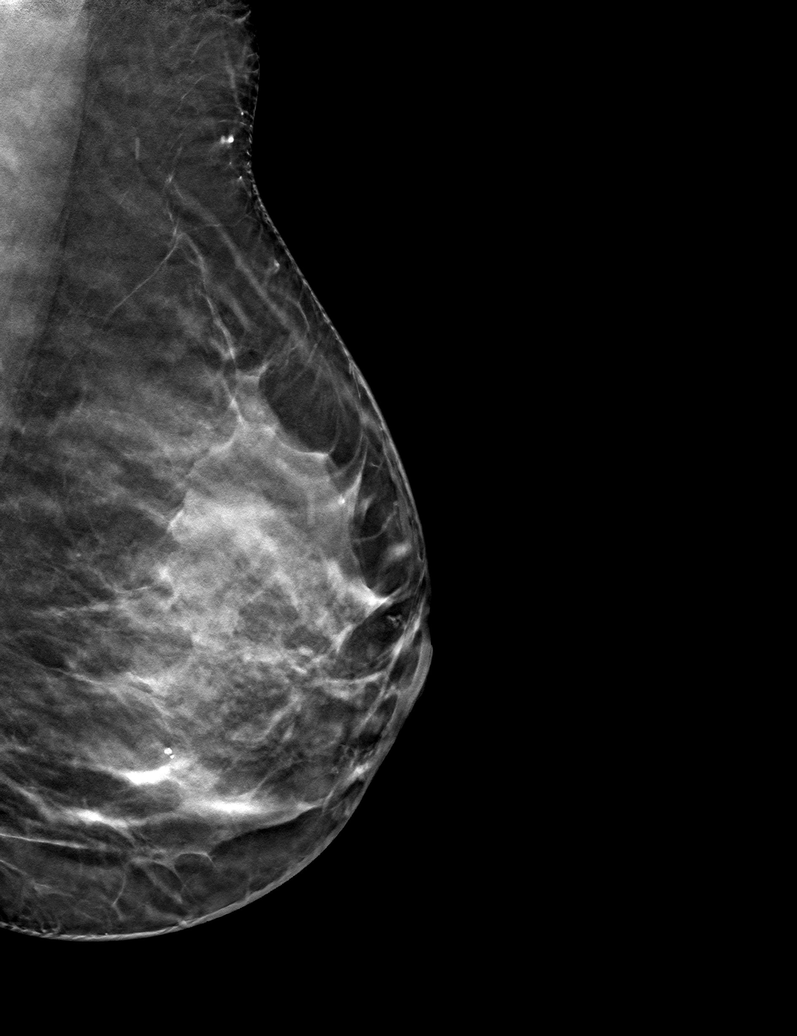

[L CC tomo · tomo slice 27/54.0]
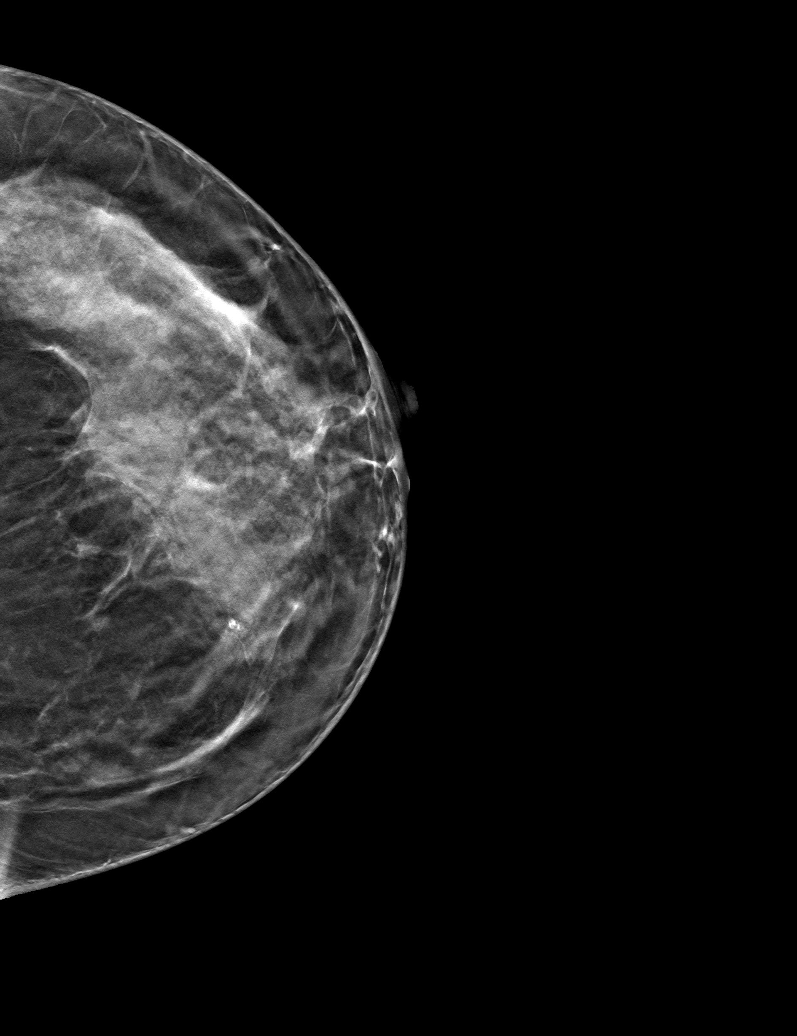

[6 of 14 positions shown; findings below may reference images not displayed]

ACR Breast Density Category d: The breast tissue is extremely dense,
which lowers the sensitivity of mammography.
FINDINGS: The small group of calcifications in the medial left breast have
become more coarse since the prior exam. They appear to be
developing in a circular manner suggestive an oil cyst. No other
suspicious calcifications, masses or areas of distortion are seen in
the left breast.

Mammographic images were processed with CAD.
IMPRESSION: 1. The calcifications in the medial left breast have become more
coarse, typically a benign feature. They appear to be developing in
a pattern seen with fat necrosis.

RECOMMENDATION:
Six-month follow-up bilateral diagnostic mammogram.

I have discussed the findings and recommendations with the patient.
If applicable, a reminder letter will be sent to the patient
regarding the next appointment.

BI-RADS CATEGORY  3: Probably benign.

## 2022-10-07 ENCOUNTER — Encounter: Payer: Self-pay | Admitting: Nurse Practitioner

## 2022-10-07 ENCOUNTER — Other Ambulatory Visit (HOSPITAL_COMMUNITY)
Admission: RE | Admit: 2022-10-07 | Discharge: 2022-10-07 | Disposition: A | Discharge status: Home / Self Care | Payer: Self-pay | Source: Ambulatory Visit | Attending: Internal Medicine | Admitting: Internal Medicine

## 2022-10-07 ENCOUNTER — Ambulatory Visit (INDEPENDENT_AMBULATORY_CARE_PROVIDER_SITE_OTHER): Payer: Self-pay | Admitting: Nurse Practitioner

## 2022-10-07 VITALS — BP 137/76 | HR 77 | Temp 98.0°F | Ht 59.0 in | Wt 138.4 lb

## 2022-10-07 DIAGNOSIS — Z13220 Encounter for screening for lipoid disorders: Secondary | ICD-10-CM

## 2022-10-07 DIAGNOSIS — Z1211 Encounter for screening for malignant neoplasm of colon: Secondary | ICD-10-CM

## 2022-10-07 DIAGNOSIS — H538 Other visual disturbances: Secondary | ICD-10-CM

## 2022-10-07 DIAGNOSIS — N9089 Other specified noninflammatory disorders of vulva and perineum: Secondary | ICD-10-CM | POA: Insufficient documentation

## 2022-10-07 DIAGNOSIS — Z124 Encounter for screening for malignant neoplasm of cervix: Secondary | ICD-10-CM | POA: Insufficient documentation

## 2022-10-07 DIAGNOSIS — Z0000 Encounter for general adult medical examination without abnormal findings: Secondary | ICD-10-CM

## 2022-10-07 DIAGNOSIS — N889 Noninflammatory disorder of cervix uteri, unspecified: Secondary | ICD-10-CM

## 2022-10-07 DIAGNOSIS — Z1329 Encounter for screening for other suspected endocrine disorder: Secondary | ICD-10-CM

## 2022-10-07 DIAGNOSIS — Z1159 Encounter for screening for other viral diseases: Secondary | ICD-10-CM

## 2022-10-07 DIAGNOSIS — Z131 Encounter for screening for diabetes mellitus: Secondary | ICD-10-CM

## 2022-10-07 DIAGNOSIS — Z1321 Encounter for screening for nutritional disorder: Secondary | ICD-10-CM

## 2022-10-07 DIAGNOSIS — Z114 Encounter for screening for human immunodeficiency virus [HIV]: Secondary | ICD-10-CM

## 2022-10-07 LAB — RESULTS CONSOLE HPV: CHL HPV: NEGATIVE

## 2022-10-07 NOTE — Assessment & Plan Note (Signed)
Annual exam as documented.  Counseling done include healthy lifestyle involving committing to 150 minutes of exercise per week, heart healthy diet, and attaining healthy weight. The importance of adequate sleep also discussed.  Regular use of seat belt and home safety were also discussed .  Immunization and cancer screening  needs are specifically addressed at this visit.   Pap exam completed, mammogram ordered.  Up-to-date with Tdap vaccine.  She declined flu vaccine she was encouraged to consider getting the flu vaccine.  Cologuard ordered to screen for colon cancer

## 2022-10-07 NOTE — Assessment & Plan Note (Signed)
Outer vagina area appears normal no redness or rashes noted on examination today. .Cervix is tender and firm on palpation, erythematous lesion noted on the cervix. Cevix  bled easily.   Specimen from the cervix sent to the lab for test.  Patient referred to OB/GYN for further examination.  Cytology  - Cytology - PAP([facility]) ordered.

## 2022-10-07 NOTE — Addendum Note (Signed)
Addended by: Donell Beers on: 10/07/2022 12:49 PM   Modules accepted: Orders

## 2022-10-07 NOTE — Assessment & Plan Note (Addendum)
Outer vagina area appears normal no redness or rashes noted on examination today. .Cervix is tender and firm on palpation, erythematous lesion noted on the cervix. Cevix  bled easily.   Specimen from the cervix sent to the lab for test.  She was encouraged to avoid using scented soap , antibacterial soap, harsh soap to wash her vaginal area to prevent irritation, keep skin clean  and dry

## 2022-10-07 NOTE — Patient Instructions (Signed)

## 2022-10-07 NOTE — Progress Notes (Signed)
New Patient Office Visit  Subjective:  Patient ID: Melanie Gibson, female    DOB: 11-Feb-1976  Age: 46 y.o. MRN: [id number]  CC:  Chief Complaint  Patient presents with   Establish Care    HPI Melanie Gibson is a 46 y.o. female with past medical history of gestational diabetes presents to establish care with new provider and for annual physical examination.  Patient is accompanied by a [place] medical interpreter.No previous PCP  Vulvar lesion patient complains of itchy tiny inflamed bumps around her vaginal area that occurs when she is about starting her menstrual period.  The rashes usually resolves after her cycle..  Beginning of her her last menstruation cycle was September 18, 2022.  Her cycle usually lasts for 3 to 5 days.  States that she has been having this rashes back-to-back the past 3 months.  Review of her records showed that she had an I&D in 2016 for a vulvar lesion.  Patient denies fever, chills, malaise, fatigue.drainage form the rashes.  States that she has one sexual partner.    Blurry vision patient complains of blurry vision in the left eye.  She stated that she saw an eye specialist 12 years ago when she was not told what was wrong with her eye.  She would like a referral to ophthalmologist today.   upto date with TDAP vaccine   she declined flu vaccine today. Cervical Pap exam completed today.     Past Medical History:  Diagnosis Date   Medical history non-contributory     Past Surgical History:  Procedure Laterality Date   NO PAST SURGERIES      Family History  Problem Relation Age of Onset   Diabetes Father    Hypertension Father    Stomach cancer Paternal Grandmother    Breast cancer Neg Hx    Cervical cancer Neg Hx     Social History   Socioeconomic History   Marital status: Married    Spouse name: Not on file   Number of children: 3   Years of education: Not on file   Highest education level: 4th grade  Occupational  History   Not on file  Tobacco Use   Smoking status: Never   Smokeless tobacco: Never  Vaping Use   Vaping Use: Never used  Substance and Sexual Activity   Alcohol use: No   Drug use: No   Sexual activity: Yes    Birth control/protection: None  Other Topics Concern   Not on file  Social History Narrative   Lives with her spouse   Social Determinants of Health   Financial Resource Strain: Not on file  Food Insecurity: Not on file  Transportation Needs: No Transportation Needs (06/01/2019)   PRAPARE - Hydrologist (Medical): No    Lack of Transportation (Non-Medical): No  Physical Activity: Not on file  Stress: Not on file  Social Connections: Not on file  Intimate Partner Violence: Not on file    ROS Review of Systems  Constitutional:  Negative for activity change, appetite change, chills, diaphoresis, fatigue and fever.  HENT:  Negative for congestion, dental problem, drooling, ear pain and facial swelling.   Eyes:  Positive for visual disturbance. Negative for pain, discharge and redness.  Respiratory:  Negative for apnea, cough, choking, chest tightness, shortness of breath and stridor.   Cardiovascular: Negative.  Negative for chest pain, palpitations and leg swelling.  Gastrointestinal: Negative.  Negative  for abdominal distention, abdominal pain, anal bleeding and blood in stool.  Endocrine: Negative.  Negative for heat intolerance, polydipsia, polyphagia and polyuria.  Genitourinary:  Negative for difficulty urinating, dyspareunia, dysuria, enuresis, flank pain, frequency and genital sores.  Musculoskeletal:  Negative for arthralgias, back pain, gait problem, joint swelling, myalgias and neck pain.  Skin:  Positive for rash. Negative for color change, pallor and wound.  Allergic/Immunologic: Negative.   Neurological:  Negative for dizziness, seizures, facial asymmetry, light-headedness, numbness and headaches.  Hematological:  Negative for  adenopathy. Does not bruise/bleed easily.  Psychiatric/Behavioral:  Negative for dysphoric mood, hallucinations, sleep disturbance and suicidal ideas. The patient is not nervous/anxious and is not hyperactive.     Objective:   Today's Vitals: BP 137/76   Pulse 77   Temp 98 F (36.7 C)   Ht _0  (1.499 m)   Wt 138 lb 6.4 oz (62.8 kg)   LMP 09/18/2022   SpO2 100%   BMI 27.95 kg/m   Physical Exam Vitals and nursing note reviewed. Exam conducted with a chaperone present.  Constitutional:      General: She is not in acute distress.    Appearance: Normal appearance. She is not ill-appearing, toxic-appearing or diaphoretic.  HENT:     Head: Normocephalic and atraumatic.     Right Ear: Tympanic membrane, ear canal and external ear normal. There is no impacted cerumen.     Left Ear: Tympanic membrane, ear canal and external ear normal. There is no impacted cerumen.     Nose: Nose normal. No congestion or rhinorrhea.     Mouth/Throat:     Mouth: Mucous membranes are moist.     Pharynx: Oropharynx is clear. No oropharyngeal exudate or posterior oropharyngeal erythema.  Eyes:     General: No scleral icterus.       Right eye: No discharge.        Left eye: No discharge.     Extraocular Movements: Extraocular movements intact.     Conjunctiva/sclera: Conjunctivae normal.  Neck:     Vascular: No carotid bruit.  Cardiovascular:     Rate and Rhythm: Normal rate and regular rhythm.     Pulses: Normal pulses.     Heart sounds: Normal heart sounds. No murmur heard.    No friction rub. No gallop.  Pulmonary:     Effort: Pulmonary effort is normal. No respiratory distress.     Breath sounds: Normal breath sounds. No stridor. No wheezing, rhonchi or rales.  Chest:     Chest wall: No mass, lacerations, deformity, swelling, tenderness, crepitus or edema.  Breasts:    Tanner Score is 5.     Breasts are symmetrical.     Right: Normal. No swelling, bleeding, inverted nipple, mass, nipple  discharge, skin change or tenderness.     Left: Normal. No swelling, bleeding, inverted nipple, mass, nipple discharge, skin change or tenderness.  Abdominal:     General: There is no distension.     Palpations: Abdomen is soft. There is no mass.     Tenderness: There is no abdominal tenderness. There is no right CVA tenderness, left CVA tenderness, guarding or rebound.     Hernia: No hernia is present. There is no hernia in the left inguinal area or right inguinal area.  Genitourinary:    Exam position: Lithotomy position.     Pubic Area: No rash or pubic lice.      Tanner stage (genital): 5.     Labia:  Right: No rash, tenderness, lesion or injury.        Left: No rash, tenderness or lesion.      Urethra: No urethral pain or urethral swelling.     Cervix: Dilated. Cervical motion tenderness, lesion, erythema and cervical bleeding present. No eversion.     Uterus: Not enlarged, not tender and no uterine prolapse.      Adnexa:        Right: No mass, tenderness or fullness.         Left: No mass, tenderness or fullness.       Comments: Cervix is tender and firm on palpation, erythematous lesion noted on the cervix. Cevix  bled easily.  Musculoskeletal:        General: No swelling, tenderness, deformity or signs of injury.     Cervical back: Normal range of motion and neck supple. No rigidity or tenderness.     Right lower leg: No edema.     Left lower leg: No edema.  Lymphadenopathy:     Cervical: No cervical adenopathy.     Upper Body:     Right upper body: No supraclavicular, axillary or pectoral adenopathy.     Left upper body: No supraclavicular, axillary or pectoral adenopathy.     Lower Body: No right inguinal adenopathy. No left inguinal adenopathy.  Skin:    General: Skin is warm and dry.     Capillary Refill: Capillary refill takes less than 2 seconds.     Coloration: Skin is not jaundiced or pale.     Findings: No bruising, erythema, lesion or rash.  Neurological:      Mental Status: She is alert and oriented to person, place, and time.     Cranial Nerves: No cranial nerve deficit.     Sensory: No sensory deficit.     Motor: No weakness.     Coordination: Coordination normal.     Gait: Gait normal.  Psychiatric:        Mood and Affect: Mood normal.        Behavior: Behavior normal.        Thought Content: Thought content normal.        Judgment: Judgment normal.     Assessment & Plan:   Problem List Items Addressed This Visit       Genitourinary   Vulvar lesion    Outer vagina area appears normal no redness or rashes noted on examination today. .Cervix is tender and firm on palpation, erythematous lesion noted on the cervix. Cevix  bled easily.   Specimen from the cervix sent to the lab for test.  She was encouraged to avoid using scented soap , antibacterial soap, harsh soap to wash her vaginal area to prevent irritation, keep skin clean  and dry        Other   Blurry vision    Left left eye.  Patient referd to ophthalmology      Relevant Orders   Ambulatory referral to Ophthalmology   Annual physical exam - Primary    Annual exam as documented.  Counseling done include healthy lifestyle involving committing to 150 minutes of exercise per week, heart healthy diet, and attaining healthy weight. The importance of adequate sleep also discussed.  Regular use of seat belt and home safety were also discussed .  Immunization and cancer screening  needs are specifically addressed at this visit.   Pap exam completed, mammogram ordered.  Up-to-date with Tdap vaccine.  She declined flu vaccine she  was encouraged to consider getting the flu vaccine.  Cologuard ordered to screen for colon cancer      Relevant Orders   CBC with Differential   CMP14+EGFR   Screening for cervical cancer    Outer vagina area appears normal no redness or rashes noted on examination today. .Cervix is tender and firm on palpation, erythematous lesion noted on the  cervix. Cevix  bled easily.   Specimen from the cervix sent to the lab for test.  Patient referred to OB/GYN for further examination.  Cytology  - Cytology - PAP([facility]) ordered.        Relevant Orders   Cytology - PAP([facility])   Other Visit Diagnoses     Screening for colon cancer       Relevant Orders   Cologuard   MM Digital Screening   Screening for diabetes mellitus       Relevant Orders   Hemoglobin A1c   Screening for lipid disorders       Relevant Orders   Lipid Panel   Vitamin D, 25-hydroxy   Encounter for vitamin deficiency screening       Screening for thyroid disorder       Relevant Orders   TSH   Need for hepatitis C screening test       Relevant Orders   Hepatitis C antibody   Screening for HIV (human immunodeficiency virus)       Relevant Orders   HIV antibody (with reflex)       Outpatient Encounter Medications as of 10/07/2022  Medication Sig   cephALEXin (KEFLEX) 500 MG capsule Take 1 capsule (500 mg total) by mouth 3 (three) times daily. (Patient not taking: Reported on 06/01/2019)   Ferrous Sulfate (IRON PO) Take 1 tablet by mouth daily. (Patient not taking: Reported on 10/07/2022)   No facility-administered encounter medications on file as of 10/07/2022.    Follow-up: Return in about 1 year (around 10/08/2023) for CPE.   Renee Rival, FNP

## 2022-10-07 NOTE — Assessment & Plan Note (Addendum)
Left left eye vision .20/100  Patient referd to ophthalmology

## 2022-10-08 ENCOUNTER — Ambulatory Visit: Payer: Self-pay | Admitting: Nurse Practitioner

## 2022-10-08 DIAGNOSIS — Z1159 Encounter for screening for other viral diseases: Secondary | ICD-10-CM

## 2022-10-08 DIAGNOSIS — Z131 Encounter for screening for diabetes mellitus: Secondary | ICD-10-CM

## 2022-10-08 DIAGNOSIS — Z114 Encounter for screening for human immunodeficiency virus [HIV]: Secondary | ICD-10-CM

## 2022-10-08 DIAGNOSIS — Z0000 Encounter for general adult medical examination without abnormal findings: Secondary | ICD-10-CM

## 2022-10-08 DIAGNOSIS — Z13220 Encounter for screening for lipoid disorders: Secondary | ICD-10-CM

## 2022-10-08 DIAGNOSIS — Z1329 Encounter for screening for other suspected endocrine disorder: Secondary | ICD-10-CM

## 2022-10-09 LAB — CBC WITH DIFFERENTIAL/PLATELET
Basophils Absolute: 0 10*3/uL (ref 0.0–0.2)
Basos: 1 %
EOS (ABSOLUTE): 0.1 10*3/uL (ref 0.0–0.4)
Eos: 1 %
Hematocrit: 40.2 % (ref 34.0–46.6)
Hemoglobin: 13.4 g/dL (ref 11.1–15.9)
Immature Grans (Abs): 0 10*3/uL (ref 0.0–0.1)
Immature Granulocytes: 0 %
Lymphocytes Absolute: 3 10*3/uL (ref 0.7–3.1)
Lymphs: 37 %
MCH: 32.2 pg (ref 26.6–33.0)
MCHC: 33.3 g/dL (ref 31.5–35.7)
MCV: 97 fL (ref 79–97)
Monocytes Absolute: 0.4 10*3/uL (ref 0.1–0.9)
Monocytes: 5 %
Neutrophils Absolute: 4.4 10*3/uL (ref 1.4–7.0)
Neutrophils: 56 %
Platelets: 280 10*3/uL (ref 150–450)
RBC: 4.16 x10E6/uL (ref 3.77–5.28)
RDW: 12.8 % (ref 11.7–15.4)
WBC: 7.9 10*3/uL (ref 3.4–10.8)

## 2022-10-09 LAB — CMP14+EGFR
ALT: 13 IU/L (ref 0–32)
AST: 20 IU/L (ref 0–40)
Albumin/Globulin Ratio: 1.3 (ref 1.2–2.2)
Albumin: 4 g/dL (ref 3.9–4.9)
Alkaline Phosphatase: 67 IU/L (ref 44–121)
BUN/Creatinine Ratio: 15 (ref 9–23)
BUN: 12 mg/dL (ref 6–24)
Bilirubin Total: 0.4 mg/dL (ref 0.0–1.2)
CO2: 22 mmol/L (ref 20–29)
Calcium: 9.4 mg/dL (ref 8.7–10.2)
Chloride: 105 mmol/L (ref 96–106)
Creatinine, Ser: 0.82 mg/dL (ref 0.57–1.00)
Globulin, Total: 3 g/dL (ref 1.5–4.5)
Glucose: 98 mg/dL (ref 70–99)
Potassium: 4 mmol/L (ref 3.5–5.2)
Sodium: 141 mmol/L (ref 134–144)
Total Protein: 7 g/dL (ref 6.0–8.5)
eGFR: 90 mL/min/{1.73_m2} (ref >59)

## 2022-10-09 LAB — VITAMIN D 25 HYDROXY (VIT D DEFICIENCY, FRACTURES): Vit D, 25-Hydroxy: 15.4 ng/mL — ABNORMAL LOW (ref 30.0–100.0)

## 2022-10-09 LAB — HIV ANTIBODY (ROUTINE TESTING W REFLEX): HIV Screen 4th Generation wRfx: NONREACTIVE

## 2022-10-09 LAB — LIPID PANEL
Chol/HDL Ratio: 4.5 ratio — ABNORMAL HIGH (ref 0.0–4.4)
Cholesterol, Total: 245 mg/dL — ABNORMAL HIGH (ref 100–199)
HDL: 55 mg/dL (ref >39)
LDL Chol Calc (NIH): 169 mg/dL — ABNORMAL HIGH (ref 0–99)
Triglycerides: 117 mg/dL (ref 0–149)
VLDL Cholesterol Cal: 21 mg/dL (ref 5–40)

## 2022-10-09 LAB — HEPATITIS C ANTIBODY: Hep C Virus Ab: NONREACTIVE

## 2022-10-09 LAB — TSH: TSH: 1.53 u[IU]/mL (ref 0.450–4.500)

## 2022-10-09 LAB — HEMOGLOBIN A1C
Est. average glucose Bld gHb Est-mCnc: 120 mg/dL
Hgb A1c MFr Bld: 5.8 % — ABNORMAL HIGH (ref 4.8–5.6)

## 2022-10-14 LAB — CYTOLOGY - PAP
Chlamydia: NEGATIVE
Comment: NEGATIVE
Comment: NEGATIVE
Comment: NEGATIVE
Comment: NORMAL
Diagnosis: NEGATIVE
HSV1: NEGATIVE
HSV2: NEGATIVE
Neisseria Gonorrhea: NEGATIVE
Trichomonas: NEGATIVE

## 2022-10-14 NOTE — Progress Notes (Signed)
Vitamin D deficiency start taking vitamin D 1000 units daily.  Eat foods rich in vitamin D like Cod liver oil,Salmon,Swordfish,Tuna fish,,Dairy and plant milks fortified with vitamin D,Sardines,Beef liver   Prediabetes avoid sugar sweets soda  Hyperlipidemia . eat a healthy diet, including lots of fruits and vegetables. Avoid foods with a lot of saturated and trans fats, such as red meat, butter, fried foods and cheese . Attain  a healthy weight.  Other labs including  cervical cytology were normal.  .The 10-year ASCVD risk score (Arnett DK, et al., 2019) is: 1.3%

## 2022-10-14 NOTE — Progress Notes (Signed)
normal

## 2023-10-08 ENCOUNTER — Ambulatory Visit: Payer: Self-pay | Admitting: Nurse Practitioner
